# Patient Record
Sex: Male | Born: 2000 | Race: White | Hispanic: No | Marital: Single | Smoking: Never smoker
Health system: Southern US, Community
[De-identification: ages and names within clinical notes are randomized; demographics above are authoritative.]

---

## 2022-01-31 ENCOUNTER — Encounter (HOSPITAL_BASED_OUTPATIENT_CLINIC_OR_DEPARTMENT_OTHER): Payer: Self-pay | Admitting: Urology

## 2022-01-31 ENCOUNTER — Other Ambulatory Visit: Payer: Self-pay

## 2022-01-31 ENCOUNTER — Emergency Department (HOSPITAL_BASED_OUTPATIENT_CLINIC_OR_DEPARTMENT_OTHER): Payer: BC Managed Care – PPO

## 2022-01-31 ENCOUNTER — Emergency Department (HOSPITAL_BASED_OUTPATIENT_CLINIC_OR_DEPARTMENT_OTHER)
Admission: EM | Admit: 2022-01-31 | Discharge: 2022-01-31 | Disposition: A | Discharge status: Home / Self Care | Payer: BC Managed Care – PPO | Attending: Emergency Medicine | Admitting: Emergency Medicine

## 2022-01-31 DIAGNOSIS — S6992XA Unspecified injury of left wrist, hand and finger(s), initial encounter: Secondary | ICD-10-CM | POA: Diagnosis present

## 2022-01-31 DIAGNOSIS — S61412A Laceration without foreign body of left hand, initial encounter: Secondary | ICD-10-CM | POA: Insufficient documentation

## 2022-01-31 DIAGNOSIS — Z23 Encounter for immunization: Secondary | ICD-10-CM | POA: Insufficient documentation

## 2022-01-31 DIAGNOSIS — W268XXA Contact with other sharp object(s), not elsewhere classified, initial encounter: Secondary | ICD-10-CM | POA: Diagnosis not present

## 2022-01-31 MED ORDER — LIDOCAINE HCL (PF) 1 % IJ SOLN
5.0000 mL | Freq: Once | INTRAMUSCULAR | Status: AC
  Administered 2022-01-31: 5 mL via INTRADERMAL
  Filled 2022-01-31: qty 5

## 2022-01-31 MED ORDER — TETANUS-DIPHTH-ACELL PERTUSSIS 5-2.5-18.5 LF-MCG/0.5 IM SUSY
0.5000 mL | PREFILLED_SYRINGE | Freq: Once | INTRAMUSCULAR | Status: AC
  Administered 2022-01-31: 0.5 mL via INTRAMUSCULAR
  Filled 2022-01-31: qty 0.5

## 2022-01-31 MED ORDER — OXYCODONE-ACETAMINOPHEN 5-325 MG PO TABS
2.0000 | ORAL_TABLET | Freq: Once | ORAL | Status: AC
  Administered 2022-01-31: 2 via ORAL
  Filled 2022-01-31: qty 2

## 2022-01-31 MED ORDER — IBUPROFEN 800 MG PO TABS
800.0000 mg | ORAL_TABLET | Freq: Once | ORAL | Status: AC
  Administered 2022-01-31: 800 mg via ORAL
  Filled 2022-01-31: qty 1

## 2022-01-31 NOTE — ED Triage Notes (Signed)
left hand laceration on pocket knife  ?Bleeding controlled with pressure ?Arterial bleeding when pressure removed, approx 1 cm lac  ? ?Tdap unknown  ?

## 2022-02-01 NOTE — ED Provider Notes (Signed)
?[facility] EMERGENCY DEPARTMENT ?Provider Note ? ? ?CSN: [id number] ?Arrival date & time: 01/31/22  3825 ? ?  ? ?History ? ?Chief Complaint  ?Patient presents with  ? Extremity Laceration  ? ? ?Russell Humphrey is a 21 y.o. male. ? ?Patient reached up to grab his box cutter which was open and cut his hand on the same.  He has a approximately 2 cm hemostatic laceration on the dorsal surface near the snuffbox.  No other injuries. ? ? ? ?  ? ?Home Medications ?Prior to Admission medications   ?Not on File  ?   ? ?Allergies    ?Amoxicillin and Penicillins   ? ?Review of Systems   ?Review of Systems ? ?Physical Exam ?Updated Vital Signs ?BP 122/82 (BP Location: Right Arm)   Pulse 74   Temp 98.1 ?F (36.7 ?C) (Oral)   Resp 18   Ht 5\' 5"  (1.651 m)   Wt 81.6 kg   SpO2 99%   BMI 29.95 kg/m?  ?Physical Exam ?Vitals and nursing note reviewed.  ?Constitutional:   ?   Appearance: He is well-developed.  ?HENT:  ?   Head: Normocephalic and atraumatic.  ?   Mouth/Throat:  ?   Mouth: Mucous membranes are moist.  ?   Pharynx: Oropharynx is clear.  ?Eyes:  ?   Pupils: Pupils are equal, round, and reactive to light.  ?Cardiovascular:  ?   Rate and Rhythm: Normal rate.  ?Pulmonary:  ?   Effort: Pulmonary effort is normal. No respiratory distress.  ?Abdominal:  ?   General: Abdomen is flat. There is no distension.  ?Musculoskeletal:     ?   General: Normal range of motion.  ?   Cervical back: Normal range of motion.  ?Skin: ?   General: Skin is warm and dry.  ?   Comments: He has a approximately 2 cm hemostatic laceration on the dorsal surface near the snuffbox.    ?Neurological:  ?   General: No focal deficit present.  ?   Mental Status: He is alert.  ? ? ?ED Results / Procedures / Treatments   ?Labs ?(all labs ordered are listed, but only abnormal results are displayed) ?Labs Reviewed - No data to display ? ?EKG ?None ? ?Radiology ?DG Hand Complete Left ? ?Result Date: 01/31/2022 ?CLINICAL DATA:  Recent left hand laceration with  pocket ninth, initial encounter EXAM: LEFT HAND - COMPLETE 3+ VIEW COMPARISON:  None Available. FINDINGS: No acute fracture or dislocation is noted. No radiopaque foreign body is seen. Soft tissue irregularity is noted laterally consistent with the recent injury. IMPRESSION: No acute bony abnormality or foreign body is noted. Electronically Signed   By: 04/02/2022 M.D.   On: 01/31/2022 01:06   ? ?Procedures ?04/02/2022.Laceration Repair ? ?Date/Time: 02/01/2022 1:02 AM ?Performed by: 04/03/2022, MD ?Authorized by: Marily Memos, MD  ? ?Consent:  ?  Consent obtained:  Verbal ?  Consent given by:  Patient ?  Risks discussed:  Infection, need for additional repair, nerve damage, poor wound healing, poor cosmetic result, pain, retained foreign body, tendon damage and vascular damage ?  Alternatives discussed:  No treatment, delayed treatment and observation ?Universal protocol:  ?  Procedure explained and questions answered to patient or proxy's satisfaction: yes   ?  Relevant documents present and verified: yes   ?  Imaging studies available: yes   ?  Site/side marked: yes   ?  Patient identity confirmed:  Hospital-assigned identification number and arm  band ?Anesthesia:  ?  Anesthesia method:  Local infiltration ?  Local anesthetic:  Lidocaine 1% w/o epi ?Laceration details:  ?  Location:  Hand ?  Hand location:  L hand, dorsum ?  Length (cm):  2 ?  Depth (mm):  4 ?Pre-procedure details:  ?  Preparation:  Patient was prepped and draped in usual sterile fashion and imaging obtained to evaluate for foreign bodies ?Exploration:  ?  Limited defect created (wound extended): no   ?  Wound exploration: wound explored through full range of motion   ?Treatment:  ?  Area cleansed with:  Saline ?  Amount of cleaning:  Extensive ?  Irrigation solution:  Sterile water ?  Irrigation method:  Syringe ?Skin repair:  ?  Repair method:  Sutures ?  Suture size:  4-0 ?  Suture material:  Prolene ?  Suture technique:  Simple interrupted ?   Number of sutures:  3 ?Approximation:  ?  Approximation:  Close ?Repair type:  ?  Repair type:  Simple ?Post-procedure details:  ?  Dressing:  Antibiotic ointment ?  Procedure completion:  Tolerated well, no immediate complications  ? ? ?Medications Ordered in ED ?Medications  ?ibuprofen (ADVIL) tablet 800 mg (800 mg Oral Given 01/31/22 0229)  ?lidocaine (PF) (XYLOCAINE) 1 % injection 5 mL (5 mLs Intradermal Given 01/31/22 0314)  ?Tdap (BOOSTRIX) injection 0.5 mL (0.5 mLs Intramuscular Given 01/31/22 0315)  ?oxyCODONE-acetaminophen (PERCOCET/ROXICET) 5-325 MG per tablet 2 tablet (2 tablets Oral Given 01/31/22 0314)  ? ? ?ED Course/ Medical Decision Making/ A&P ?  ?                        ?Medical Decision Making ?Amount and/or Complexity of Data Reviewed ?Radiology: ordered. ? ?Risk ?Prescription drug management. ? ? ?Simple wound repaired as above. Tdap updated. Pain controlled. Stable for d/c.  ? ?Final Clinical Impression(s) / ED Diagnoses ?Final diagnoses:  ?Laceration of left hand without foreign body, initial encounter  ? ? ?Rx / DC Orders ?ED Discharge Orders   ? ? None  ? ?  ? ? ?  ?Marily Memos, MD ?02/01/22 0104 ? ?

## 2023-05-28 IMAGING — DX DG HAND COMPLETE 3+V*L*
3 series · 3 of 3 positions shown · non-contrast
Comparison: None Available.

CLINICAL DATA: Recent left hand laceration with pocket ninth,
initial encounter

EXAM:
LEFT HAND - COMPLETE 3+ VIEW

[hand pa]
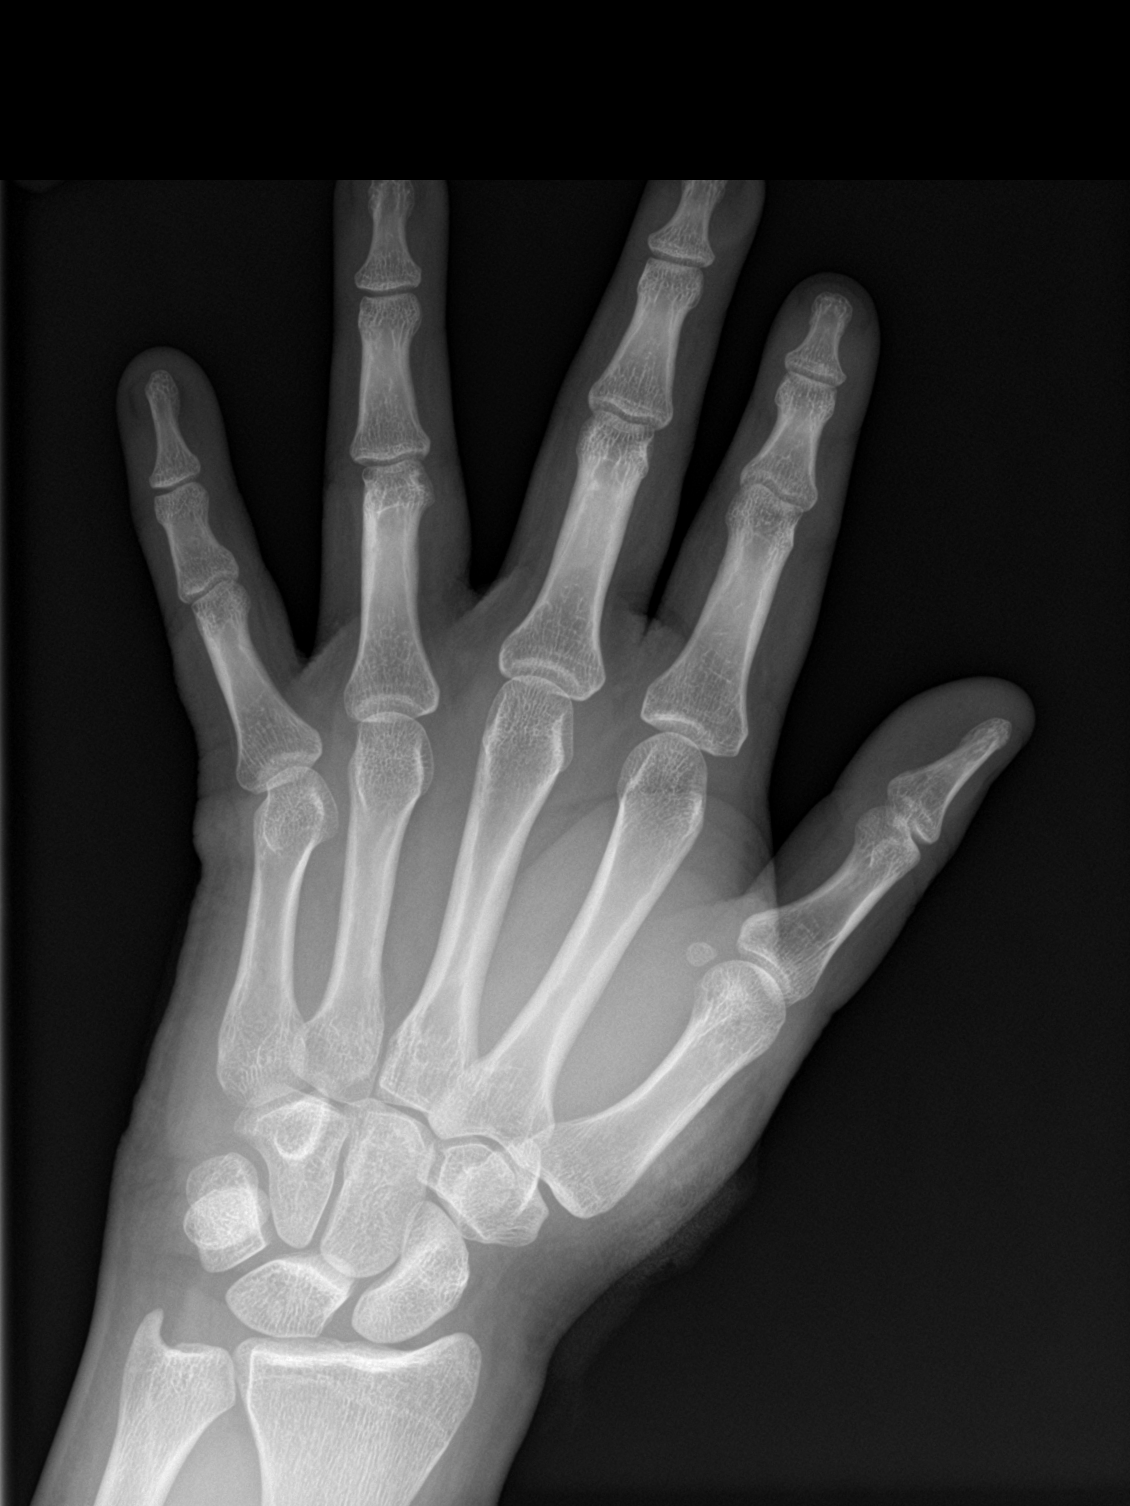

[hand obl]
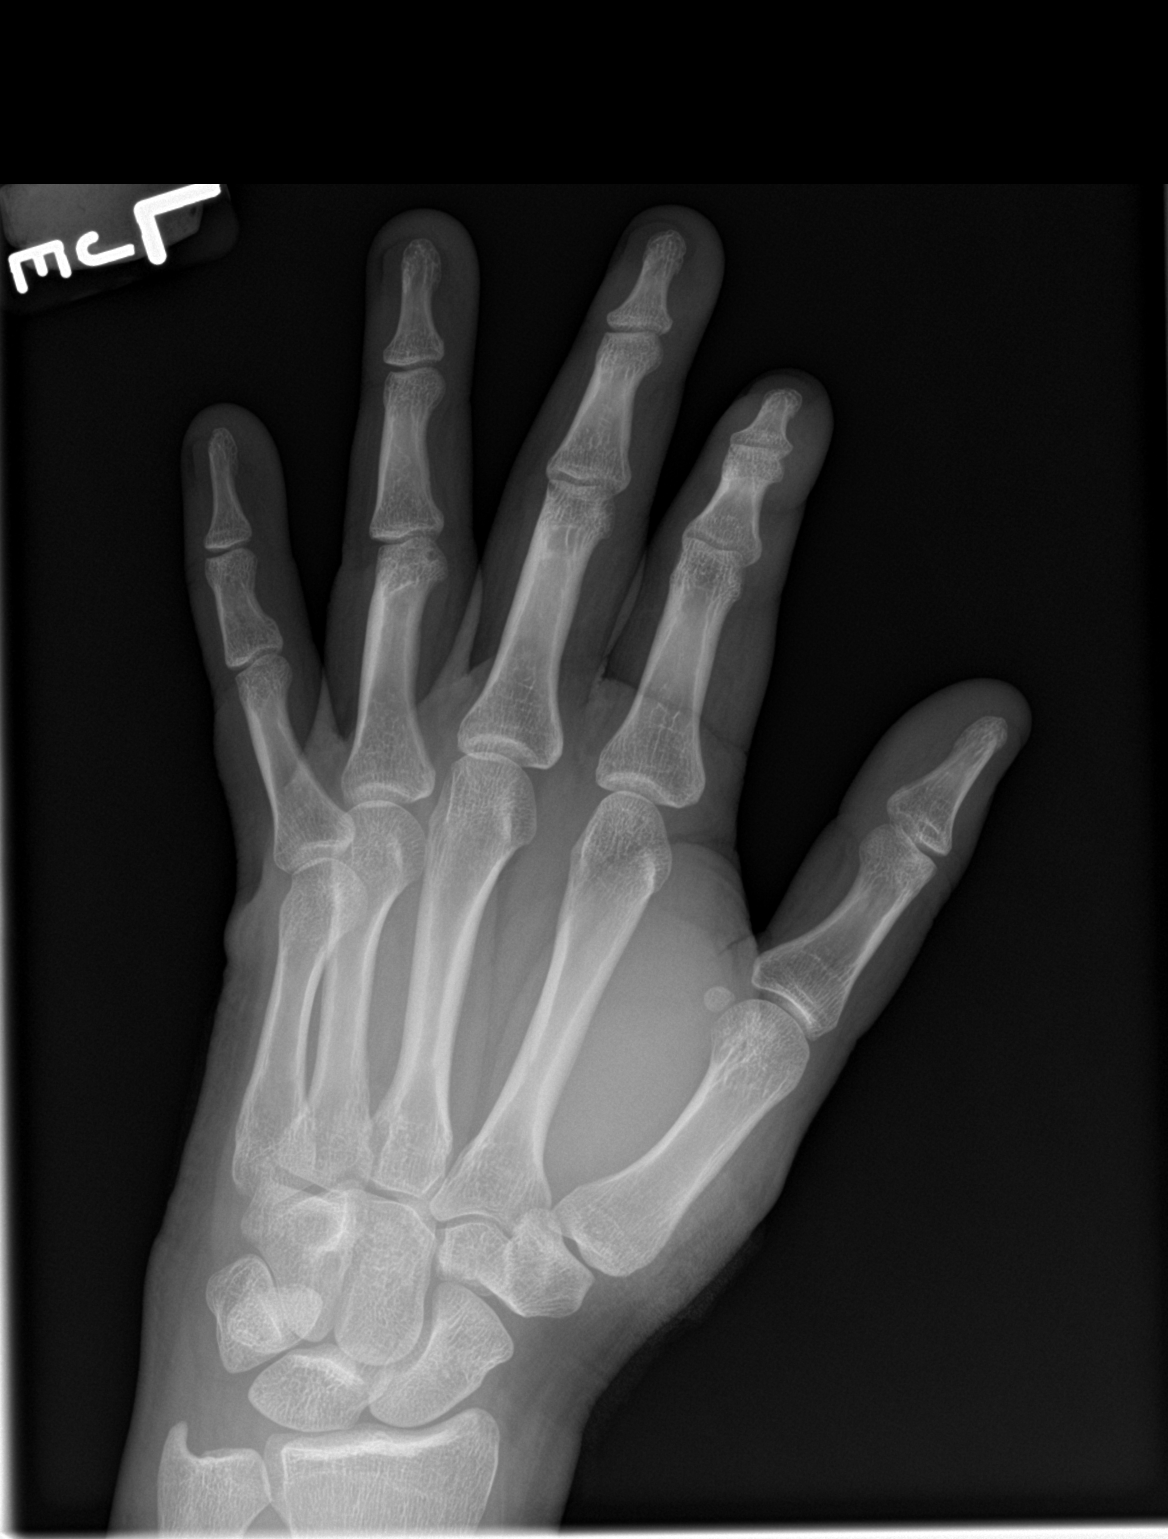

[hand lat]
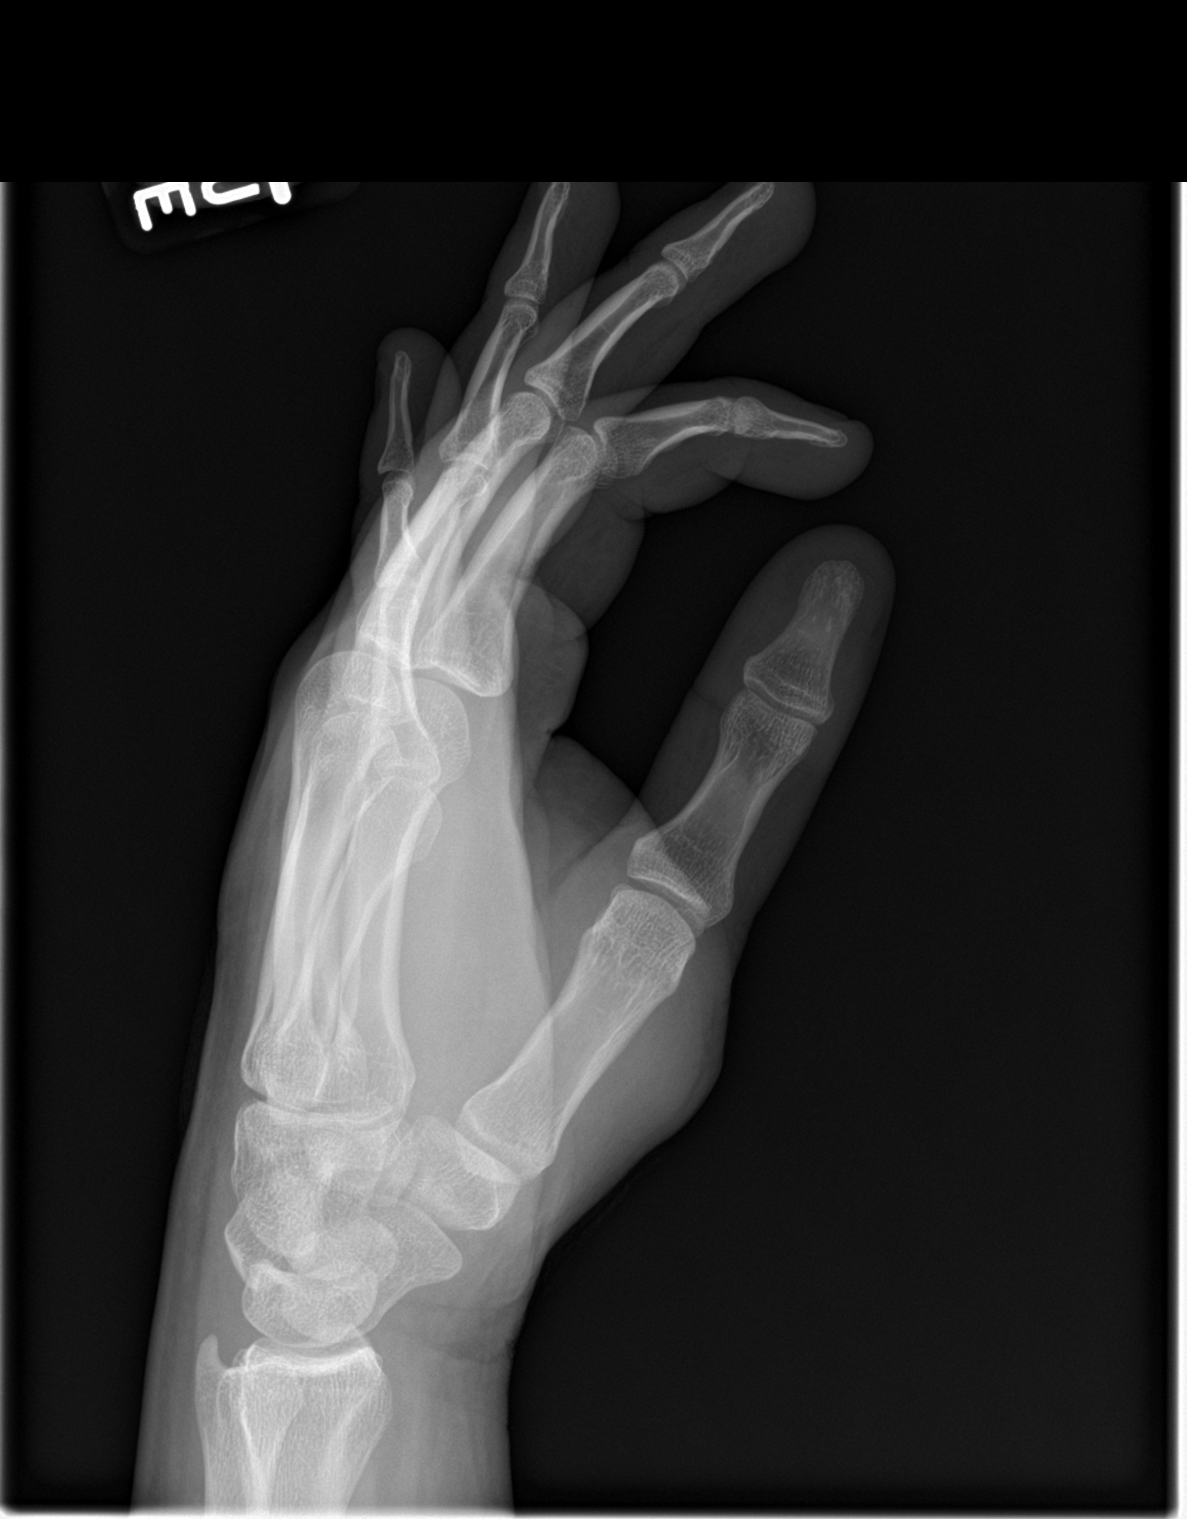

[3 of 3 positions shown; findings below may reference images not displayed]

FINDINGS: No acute fracture or dislocation is noted. No radiopaque foreign
body is seen. Soft tissue irregularity is noted laterally consistent
with the recent injury.
IMPRESSION: No acute bony abnormality or foreign body is noted.
